# Patient Record
Sex: Male | Born: 2000 | Hispanic: Yes | Marital: Single | Smoking: Never smoker
Health system: Southern US, Community
[De-identification: ages and names within clinical notes are randomized; demographics above are authoritative.]

## PROBLEM LIST (undated history)

## (undated) HISTORY — PX: NO PAST SURGERIES: SHX2092

---
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
## 2018-02-28 ENCOUNTER — Ambulatory Visit
Admission: EM | Admit: 2018-02-28 | Discharge: 2018-02-28 | Disposition: A | Discharge status: Home / Self Care | Payer: Worker's Compensation | Attending: Family Medicine | Admitting: Family Medicine

## 2018-02-28 ENCOUNTER — Other Ambulatory Visit: Payer: Self-pay

## 2018-02-28 ENCOUNTER — Ambulatory Visit (INDEPENDENT_AMBULATORY_CARE_PROVIDER_SITE_OTHER): Payer: Worker's Compensation

## 2018-02-28 DIAGNOSIS — M545 Low back pain, unspecified: Secondary | ICD-10-CM

## 2018-02-28 MED ORDER — MELOXICAM 15 MG PO TABS: 15.0000 mg | ORAL_TABLET | Freq: Every day | ORAL | 0 refills | Status: AC | PRN

## 2018-02-28 MED ORDER — CYCLOBENZAPRINE HCL 10 MG PO TABS: 10.0000 mg | ORAL_TABLET | Freq: Every evening | ORAL | 0 refills | Status: AC | PRN
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
## 2018-02-28 NOTE — Discharge Instructions (Signed)
Take medication as prescribed. Rest. Drink plenty of fluids.  No heavy lifting.  Do not lift greater than 10 pounds.  No frequent bending.  Follow-up with occupational health in 1 week as discussed for follow-up.  See above to call to schedule.  Follow up with your primary care physician this week as needed. Return to Urgent care for new or worsening concerns.

## 2018-04-18 ENCOUNTER — Encounter: Payer: Self-pay | Admitting: Podiatry

## 2018-04-24 NOTE — Progress Notes (Signed)
This encounter was created in error - please disregard.

## 2018-12-05 IMAGING — CR DG THORACIC SPINE 2V
4 series · 4 of 4 positions shown · non-contrast
Comparison: None.

CLINICAL DATA: Low back pain

EXAM:
THORACIC SPINE 2 VIEWS

[t-spine ap]
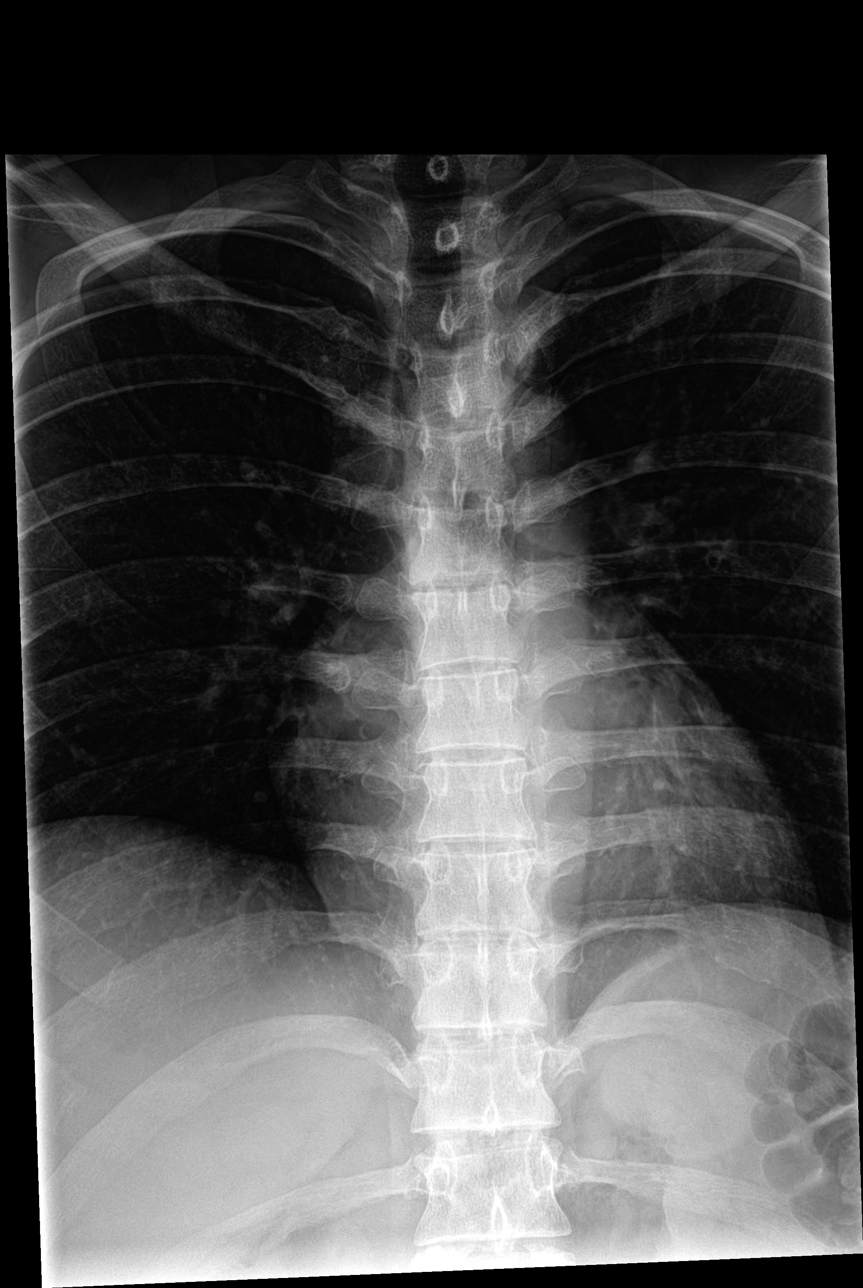

[t-spine lat (1 of 2)]
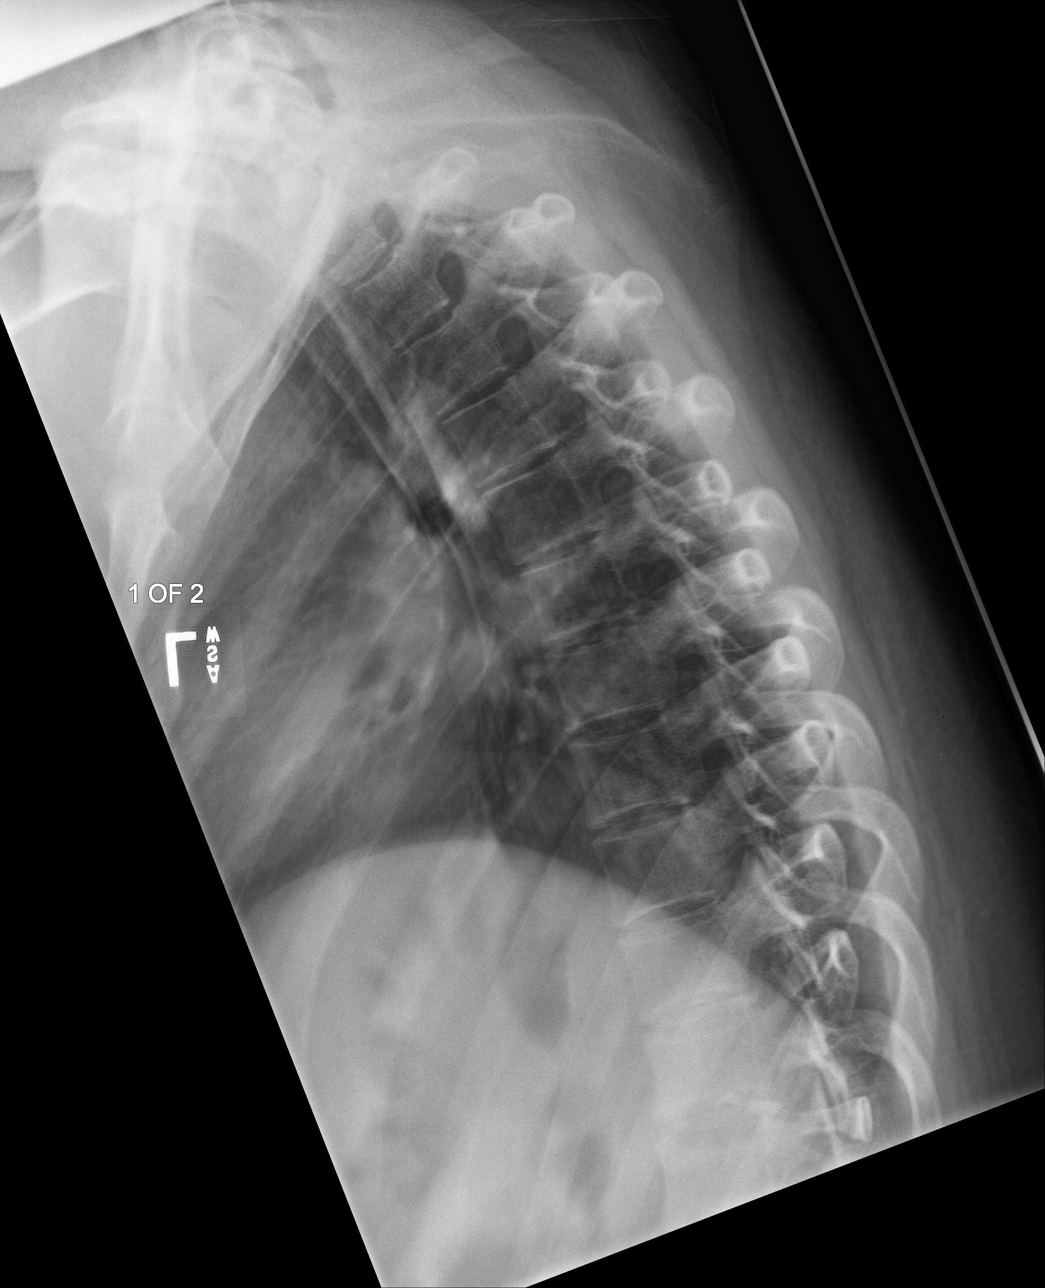

[t-spine swimmers]
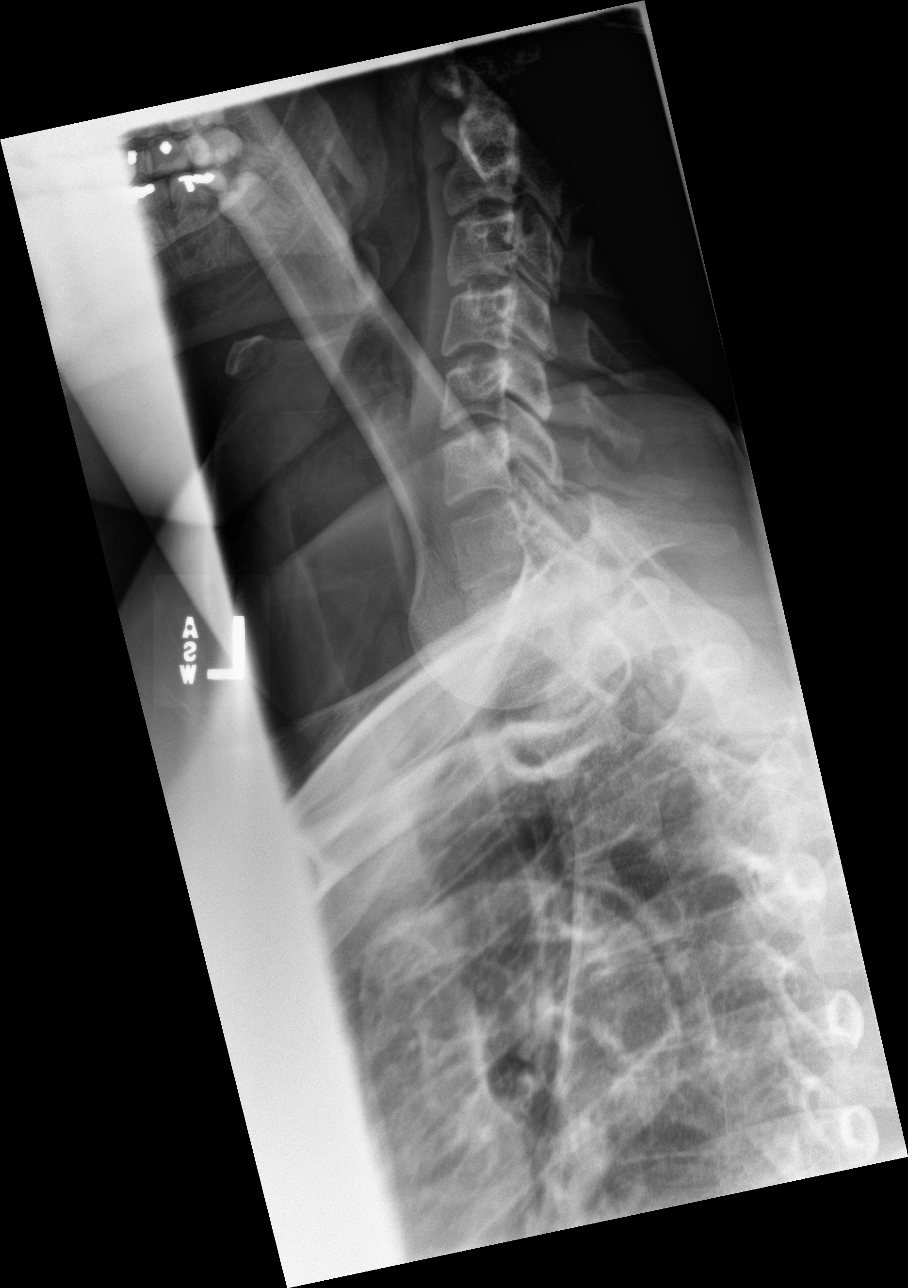

[t-spine lat (2 of 2)]
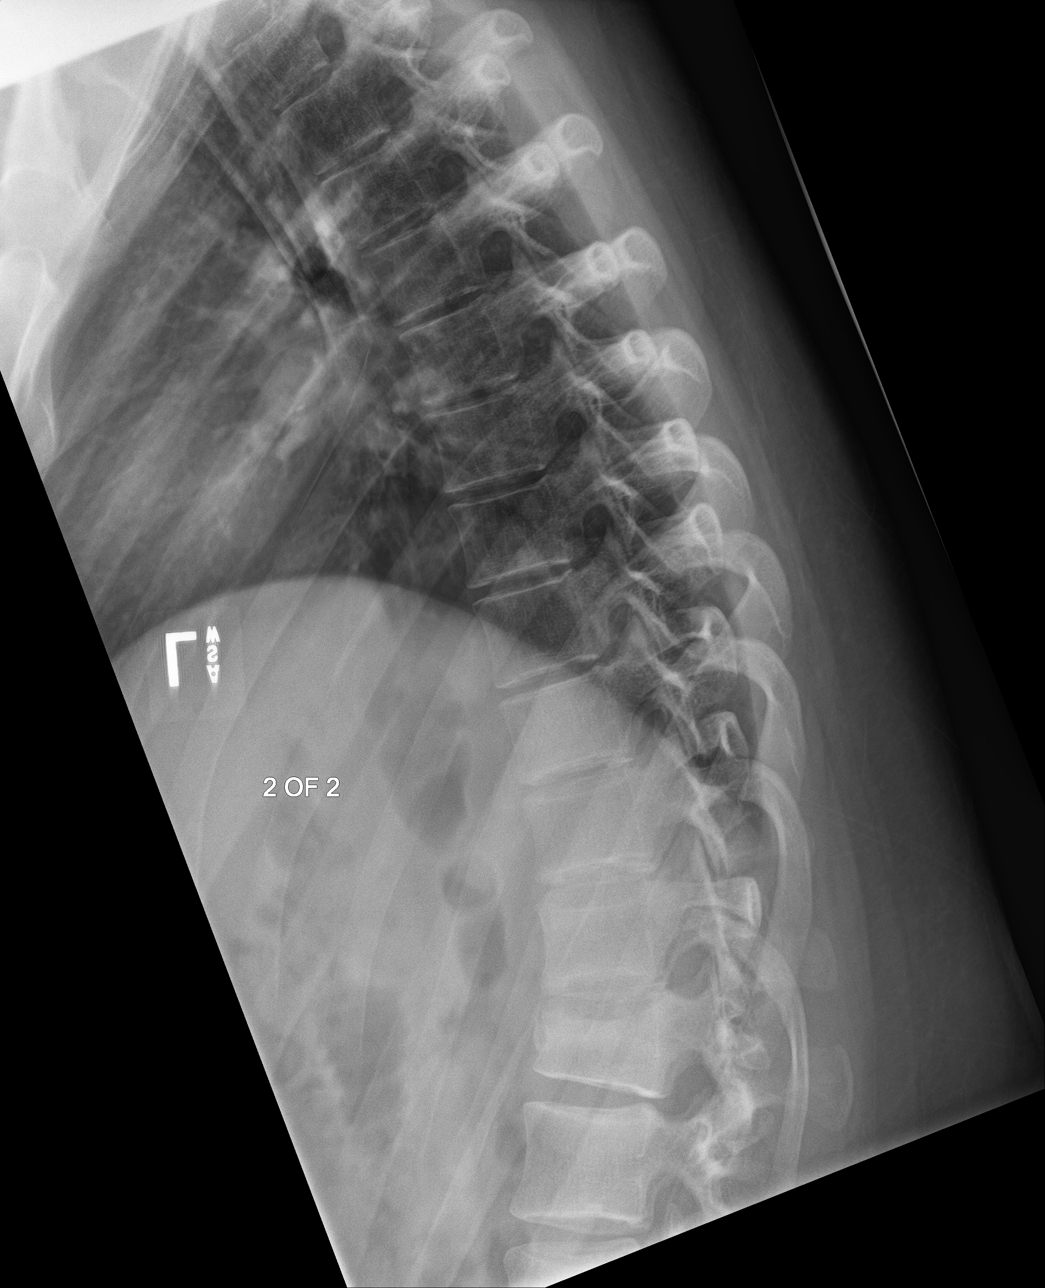

[4 of 4 positions shown; findings below may reference images not displayed]

FINDINGS: There is no evidence of thoracic spine fracture. Alignment is
normal. No other significant bone abnormalities are identified.
IMPRESSION: Negative.

## 2019-01-26 ENCOUNTER — Encounter: Payer: Self-pay | Admitting: Podiatry

## 2019-01-26 ENCOUNTER — Ambulatory Visit (INDEPENDENT_AMBULATORY_CARE_PROVIDER_SITE_OTHER): Payer: Medicaid Other | Admitting: Podiatry

## 2019-01-26 ENCOUNTER — Other Ambulatory Visit: Payer: Self-pay

## 2019-01-26 VITALS — BP 123/79 | HR 77 | Temp 97.9°F

## 2019-01-26 DIAGNOSIS — L600 Ingrowing nail: Secondary | ICD-10-CM

## 2019-01-26 MED ORDER — GENTAMICIN SULFATE 0.1 % EX CREA: 1.0000 "application " | TOPICAL_CREAM | Freq: Two times a day (BID) | CUTANEOUS | 1 refills | Status: AC

## 2019-01-26 NOTE — Patient Instructions (Signed)

## 2019-01-29 NOTE — Progress Notes (Signed)
   Subjective: Patient presents today for evaluation of pain to the medial border of the right hallux that began a few weeks ago. He reports associated swelling and tenderness. Patient is concerned for possible ingrown nail. He states he tried to cut the nail out himself but it then became infected. He was seen at an urgent care center and prescribed antibiotics which resolved the purulent drainage. Wearing shoes increases the pain. Patient presents today for further treatment and evaluation.  History reviewed. No pertinent past medical history.  Objective:  General: Well developed, nourished, in no acute distress, alert and oriented x3   Dermatology: Skin is warm, dry and supple bilateral. Medial border of the right hallux appears to be erythematous with evidence of an ingrowing nail. Pain on palpation noted to the border of the nail fold. The remaining nails appear unremarkable at this time. There are no open sores, lesions.  Vascular: Dorsalis Pedis artery and Posterior Tibial artery pedal pulses palpable. No lower extremity edema noted.   Neruologic: Grossly intact via light touch bilateral.  Musculoskeletal: Muscular strength within normal limits in all groups bilateral. Normal range of motion noted to all pedal and ankle joints.   Assesement: #1 Paronychia with ingrowing nail medial border right hallux  #2 Pain in toe #3 Incurvated nail  Plan of Care:  1. Patient evaluated.  2. Discussed treatment alternatives and plan of care. Explained nail avulsion procedure and post procedure course to patient. 3. Patient opted for permanent partial nail avulsion medial border right hallux.  4. Prior to procedure, local anesthesia infiltration utilized using 3 ml of a 50:50 mixture of 2% plain lidocaine and 0.5% plain marcaine in a normal hallux block fashion and a betadine prep performed.  5. Partial permanent nail avulsion with chemical matrixectomy performed using [id number] applications of  phenol followed by alcohol flush.  6. Light dressing applied. 7. Prescription for Gentamicin cream provided to patient to use daily with a bandage.  8. Return to clinic in 2 weeks.   Edrick Kins, DPM [facility]  Dr. Edrick Kins, [address]                                        [place], [place] [address]                Office [phone]  Fax [phone]

## 2019-02-09 ENCOUNTER — Ambulatory Visit: Payer: No Typology Code available for payment source | Admitting: Podiatry

## 2020-01-10 ENCOUNTER — Other Ambulatory Visit
Admission: RE | Admit: 2020-01-10 | Discharge: 2020-01-10 | Disposition: A | Discharge status: Home / Self Care | Payer: Medicaid Other | Attending: Family Medicine | Admitting: Family Medicine

## 2020-01-10 DIAGNOSIS — E7439 Other disorders of intestinal carbohydrate absorption: Secondary | ICD-10-CM | POA: Insufficient documentation

## 2020-01-10 DIAGNOSIS — R635 Abnormal weight gain: Secondary | ICD-10-CM | POA: Insufficient documentation

## 2020-01-10 LAB — COMPREHENSIVE METABOLIC PANEL
ALT: 43 U/L (ref 0–44)
AST: 29 U/L (ref 15–41)
Albumin: 3.9 g/dL (ref 3.5–5.0)
Alkaline Phosphatase: 100 U/L (ref 38–126)
Anion gap: 8 (ref 5–15)
BUN: 14 mg/dL (ref 6–20)
CO2: 25 mmol/L (ref 22–32)
Calcium: 8.8 mg/dL — ABNORMAL LOW (ref 8.9–10.3)
Chloride: 104 mmol/L (ref 98–111)
Creatinine, Ser: 0.79 mg/dL (ref 0.61–1.24)
GFR calc Af Amer: >60 mL/min (ref >60)
GFR calc non Af Amer: >60 mL/min (ref >60)
Glucose, Bld: 104 mg/dL — ABNORMAL HIGH (ref 70–99)
Potassium: 4.2 mmol/L (ref 3.5–5.1)
Sodium: 137 mmol/L (ref 135–145)
Total Bilirubin: 1 mg/dL (ref 0.3–1.2)
Total Protein: 6.9 g/dL (ref 6.5–8.1)

## 2020-01-10 LAB — LIPID PANEL
Cholesterol: 175 mg/dL (ref 0–200)
HDL: 33 mg/dL — ABNORMAL LOW (ref >40)
LDL Cholesterol: 118 mg/dL — ABNORMAL HIGH (ref 0–99)
Total CHOL/HDL Ratio: 5.3 RATIO
Triglycerides: 118 mg/dL (ref <150)
VLDL: 24 mg/dL (ref 0–40)

## 2020-01-10 LAB — TSH: TSH: 0.67 u[IU]/mL (ref 0.350–4.500)

## 2020-01-11 LAB — HEMOGLOBIN A1C
Hgb A1c MFr Bld: 5.8 % — ABNORMAL HIGH (ref 4.8–5.6)
Mean Plasma Glucose: 120 mg/dL

## 2021-10-27 ENCOUNTER — Other Ambulatory Visit
Admission: RE | Admit: 2021-10-27 | Discharge: 2021-10-27 | Disposition: A | Discharge status: Home / Self Care | Payer: Medicaid Other | Source: Ambulatory Visit | Attending: Family Medicine | Admitting: Family Medicine

## 2021-10-27 DIAGNOSIS — R635 Abnormal weight gain: Secondary | ICD-10-CM | POA: Insufficient documentation

## 2021-10-27 LAB — LIPID PANEL
Cholesterol: 184 mg/dL (ref 0–200)
HDL: 34 mg/dL — ABNORMAL LOW (ref >40)
LDL Cholesterol: 130 mg/dL — ABNORMAL HIGH (ref 0–99)
Total CHOL/HDL Ratio: 5.4 RATIO
Triglycerides: 100 mg/dL (ref <150)
VLDL: 20 mg/dL (ref 0–40)

## 2021-10-27 LAB — COMPREHENSIVE METABOLIC PANEL
ALT: 29 U/L (ref 0–44)
AST: 25 U/L (ref 15–41)
Albumin: 3.9 g/dL (ref 3.5–5.0)
Alkaline Phosphatase: 88 U/L (ref 38–126)
Anion gap: 8 (ref 5–15)
BUN: 14 mg/dL (ref 6–20)
CO2: 24 mmol/L (ref 22–32)
Calcium: 9.3 mg/dL (ref 8.9–10.3)
Chloride: 106 mmol/L (ref 98–111)
Creatinine, Ser: 0.82 mg/dL (ref 0.61–1.24)
GFR, Estimated: >60 mL/min (ref >60)
Glucose, Bld: 94 mg/dL (ref 70–99)
Potassium: 4 mmol/L (ref 3.5–5.1)
Sodium: 138 mmol/L (ref 135–145)
Total Bilirubin: 0.7 mg/dL (ref 0.3–1.2)
Total Protein: 7.3 g/dL (ref 6.5–8.1)

## 2021-10-27 LAB — VITAMIN D 25 HYDROXY (VIT D DEFICIENCY, FRACTURES): Vit D, 25-Hydroxy: 29.78 ng/mL — ABNORMAL LOW (ref 30–100)

## 2021-10-27 LAB — HEMOGLOBIN A1C
Hgb A1c MFr Bld: 5.4 % (ref 4.8–5.6)
Mean Plasma Glucose: 108.28 mg/dL
